# Patient Record
Sex: Female | Born: 1990 | Race: Black or African American | Hispanic: No | Marital: Married | Smoking: Never smoker
Health system: Southern US, Community
[De-identification: ages and names within clinical notes are randomized; demographics above are authoritative.]

## PROBLEM LIST (undated history)

## (undated) DIAGNOSIS — N912 Amenorrhea, unspecified: Secondary | ICD-10-CM

## (undated) HISTORY — DX: Amenorrhea, unspecified: N91.2

---
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
## 2015-08-09 ENCOUNTER — Encounter: Payer: Self-pay | Admitting: Obstetrics and Gynecology

## 2015-08-15 ENCOUNTER — Ambulatory Visit (INDEPENDENT_AMBULATORY_CARE_PROVIDER_SITE_OTHER): Payer: BLUE CROSS/BLUE SHIELD | Admitting: Obstetrics and Gynecology

## 2015-08-15 ENCOUNTER — Encounter: Payer: Self-pay | Admitting: Obstetrics and Gynecology

## 2015-08-15 VITALS — BP 118/78 | HR 84 | Resp 14 | Ht 66.0 in | Wt 144.0 lb

## 2015-08-15 DIAGNOSIS — N915 Oligomenorrhea, unspecified: Secondary | ICD-10-CM | POA: Diagnosis not present

## 2015-08-15 DIAGNOSIS — B373 Candidiasis of vulva and vagina: Secondary | ICD-10-CM | POA: Diagnosis not present

## 2015-08-15 DIAGNOSIS — N898 Other specified noninflammatory disorders of vagina: Secondary | ICD-10-CM | POA: Diagnosis not present

## 2015-08-15 DIAGNOSIS — Z0000 Encounter for general adult medical examination without abnormal findings: Secondary | ICD-10-CM

## 2015-08-15 DIAGNOSIS — Z124 Encounter for screening for malignant neoplasm of cervix: Secondary | ICD-10-CM | POA: Diagnosis not present

## 2015-08-15 DIAGNOSIS — Z113 Encounter for screening for infections with a predominantly sexual mode of transmission: Secondary | ICD-10-CM | POA: Diagnosis not present

## 2015-08-15 DIAGNOSIS — Z01419 Encounter for gynecological examination (general) (routine) without abnormal findings: Secondary | ICD-10-CM

## 2015-08-15 DIAGNOSIS — B3731 Acute candidiasis of vulva and vagina: Secondary | ICD-10-CM

## 2015-08-15 LAB — HDL CHOLESTEROL: HDL: 67 mg/dL (ref >=46)

## 2015-08-15 LAB — TSH: TSH: 1.385 u[IU]/mL (ref 0.350–4.500)

## 2015-08-15 LAB — HEMOGLOBIN, FINGERSTICK: HEMOGLOBIN, FINGERSTICK: 13.6 g/dL (ref 12.0–16.0)

## 2015-08-15 LAB — CHOLESTEROL, TOTAL: Cholesterol: 153 mg/dL (ref 125–200)

## 2015-08-15 LAB — POCT URINE PREGNANCY: Preg Test, Ur: NEGATIVE

## 2015-08-15 MED ORDER — FLUCONAZOLE 150 MG PO TABS: ORAL_TABLET | ORAL | Status: DC

## 2015-08-15 MED ORDER — LEVONORGEST-ETH ESTRAD 91-DAY 0.1-0.02 & 0.01 MG PO TABS: 1.0000 | ORAL_TABLET | Freq: Every day | ORAL | Status: DC

## 2015-08-15 NOTE — Progress Notes (Signed)
Patient ID: Lori Castillo, female   DOB: [birth year], 24 y.o.   MRN: [id number] 24 y.o. G0P0000 SingleAfrican AmericanF here for annual exam.  The patient reports menses q  2-3 months, typically bleeds x 4 days. Can saturate a super tampon in 4 hours, no BTB. Currently using condoms and w/d for contraception. No thyroid c/o, no galactorrhea, no hirsutism.  Period Duration (Days): 4 days  Period Pattern: (!) Irregular Menstrual Flow: Moderate Menstrual Control: Maxi pad, Tampon Dysmenorrhea: None On questioning she c/o a one week h/o severe vulvar pruritus, slight odor and a slight increase in white d/c.    Patient's last menstrual period was 06/11/2015.          Sexually active: Yes.    The current method of family planning is condoms sometimes.    Exercising: No.  The patient does not participate in regular exercise at present. Smoker:  no  Health Maintenance: Pap:  2015 normal per patient History of abnormal Pap:  no TDaP:  04/2015  Gardasil: N/A   reports that she has never smoked. She has never used smokeless tobacco. She reports that she drinks about 0.6 oz of alcohol per week. 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   Past Medical History  Diagnosis Date  . Amenorrhea     Past Surgical History  Procedure Laterality Date  . Nasal sinus surgery  2006    No current outpatient prescriptions on file.   No current facility-administered medications for this visit.    Family History  Problem Relation Age of Onset  . Diabetes Maternal Aunt   . Hypertension Paternal Aunt   . Diabetes Paternal Aunt   . Hypertension Paternal Uncle   . Diabetes Paternal Uncle   . Hypertension Paternal Grandmother   . Diabetes Paternal Grandmother   . Hypertension Paternal Grandfather   . Diabetes Paternal Grandfather     Review of Systems  Constitutional: Negative.   HENT: Negative.   Eyes: Negative.   Respiratory: Negative.    Cardiovascular: Negative.   Gastrointestinal: Negative.   Endocrine: Negative.   Genitourinary: Positive for menstrual problem.  Musculoskeletal: Negative.   Skin: Negative.   Allergic/Immunologic: Negative.   Neurological: Negative.   Psychiatric/Behavioral: Negative.     Exam:   BP 118/78 mmHg  Pulse 84  Resp 14  Ht  (1.676 m)  Wt 144 lb (65.318 kg)  BMI 23.25 kg/m2  LMP 06/11/2015  Weight change: @ Height:   Height:  (167.6 cm)  Ht Readings from Last 3 Encounters:  08/15/15  (1.676 m)    General appearance: alert, cooperative and appears stated age Head: Normocephalic, without obvious abnormality, atraumatic Neck: no adenopathy, supple, symmetrical, trachea midline and thyroid normal to inspection and palpation Lungs: clear to auscultation bilaterally Breasts: normal appearance, no masses or tenderness Heart: regular rate and rhythm Abdomen: soft, non-tender; bowel sounds normal; no masses,  no organomegaly Extremities: extremities normal, atraumatic, no cyanosis or edema Skin: Skin color, texture, turgor normal. No rashes or lesions Lymph nodes: Cervical, supraclavicular, and axillary nodes normal. No abnormal inguinal nodes palpated Neurologic: Grossly normal   Pelvic: External genitalia:  no lesions, slight erythema              Urethra:  normal appearing urethra with no masses, tenderness or lesions              Bartholins and Skenes: normal  Vagina: erythematous with a thick, clumpy, white vaginal d/c.               Cervix: no lesions               Bimanual Exam:  Uterus:  normal size, contour, position, consistency, mobility, non-tender              Adnexa: no mass, fullness, tenderness               Rectovaginal: Confirms               Anus:  normal sphincter tone, no lesions  Chaperone was present for exam.  A:  Well Woman with normal exam  Oligomenorrhea  Yeast vaginitis, Possible BV on wet prep as  well  Contraception  Screening for STD  P:   UPT negative  TSH, prolactin  Provera 5 mg x 5 days, she will call if no bleeding on OCP's  Will start Loseasonique on the first day of her pill, risks reviewed, no contraindication  Condoms encouraged  F/U for a pill check in 3 months  STD testing  Wet prep probe  Cholesterol/HDL

## 2015-08-15 NOTE — Patient Instructions (Addendum)
EXERCISE AND DIET:  We recommended that you start or continue a regular exercise program for good health. Regular exercise means any activity that makes your heart beat faster and makes you sweat.  We recommend exercising at least 30 minutes per day at least 3 days a week, preferably 4 or 5.  We also recommend a diet low in fat and sugar.  Inactivity, poor dietary choices and obesity can cause diabetes, heart attack, stroke, and kidney damage, among others.    ALCOHOL AND SMOKING:  Women should limit their alcohol intake to no more than 7 drinks/beers/glasses of wine (combined, not each!) per week. Moderation of alcohol intake to this level decreases your risk of breast cancer and liver damage. And of course, no recreational drugs are part of a healthy lifestyle.  And absolutely no smoking or even second hand smoke. Most people know smoking can cause heart and lung diseases, but did you know it also contributes to weakening of your bones? Aging of your skin?  Yellowing of your teeth and nails?  CALCIUM AND VITAMIN D:  Adequate intake of calcium and Vitamin D are recommended.  The recommendations for exact amounts of these supplements seem to change often, but generally speaking 600 mg of calcium (either carbonate or citrate) and 800 units of Vitamin D per day seems prudent. Certain women may benefit from higher intake of Vitamin D.  If you are among these women, your doctor will have told you during your visit.    PAP SMEARS:  Pap smears, to check for cervical cancer or precancers,  have traditionally been done yearly, although recent scientific advances have shown that most women can have pap smears less often.  However, every woman still should have a physical exam from her gynecologist every year. It will include a breast check, inspection of the vulva and vagina to check for abnormal growths or skin changes, a visual exam of the cervix, and then an exam to evaluate the size and shape of the uterus and  ovaries.  And after 22 years of age, a rectal exam is indicated to check for rectal cancers. We will also provide age appropriate advice regarding health maintenance, like when you should have certain vaccines, screening for sexually transmitted diseases, bone density testing, colonoscopy, mammograms, etc.   MAMMOGRAMS:  All women over 40 years old should have a yearly mammogram. Many facilities now offer a "3D" mammogram, which may cost around $50 extra out of pocket. If possible,  we recommend you accept the option to have the 3D mammogram performed.  It both reduces the number of women who will be called back for extra views which then turn out to be normal, and it is better than the routine mammogram at detecting truly abnormal areas.    COLONOSCOPY:  Colonoscopy to screen for colon cancer is recommended for all women at age 50.  We know, you hate the idea of the prep.  We agree, BUT, having colon cancer and not knowing it is worse!!  Colon cancer so often starts as a polyp that can be seen and removed at colonscopy, which can quite literally save your life!  And if your first colonoscopy is normal and you have no family history of colon cancer, most women don't have to have it again for 10 years.  Once every ten years, you can do something that may end up saving your life, right?  We will be happy to help you get it scheduled when you are ready.    Be sure to check your insurance coverage so you understand how much it will cost.  It may be covered as a preventative service at no cost, but you should check your particular policy.     Oral Contraception Information Oral contraceptive pills (OCPs) are medicines taken to prevent pregnancy. OCPs work by preventing the ovaries from releasing eggs. The hormones in OCPs also cause the cervical mucus to thicken, preventing the sperm from entering the uterus. The hormones also cause the uterine lining to become thin, not allowing a fertilized egg to attach to the  inside of the uterus. OCPs are highly effective when taken exactly as prescribed. However, OCPs do not prevent sexually transmitted diseases (STDs). Safe sex practices, such as using condoms along with the pill, can help prevent STDs.  Before taking the pill, you may have a physical exam and Pap test. Your health care provider may order blood tests. The health care provider will make sure you are a good candidate for oral contraception. Discuss with your health care provider the possible side effects of the OCP you may be prescribed. When starting an OCP, it can take 2 to 3 months for the body to adjust to the changes in hormone levels in your body.  TYPES OF ORAL CONTRACEPTION  The combination pill--This pill contains estrogen and progestin (synthetic progesterone) hormones. The combination pill comes in 21-day, 28-day, or 91-day packs. Some types of combination pills are meant to be taken continuously (365-day pills). With 21-day packs, you do not take pills for 7 days after the last pill. With 28-day packs, the pill is taken every day. The last 7 pills are without hormones. Certain types of pills have more than 21 hormone-containing pills. With 91-day packs, the first 84 pills contain both hormones, and the last 7 pills contain no hormones or contain estrogen only.  The minipill--This pill contains the progesterone hormone only. The pill is taken every day continuously. It is very important to take the pill at the same time each day. The minipill comes in packs of 28 pills. All 28 pills contain the hormone.  ADVANTAGES OF ORAL CONTRACEPTIVE PILLS  Decreases premenstrual symptoms.   Treats menstrual period cramps.   Regulates the menstrual cycle.   Decreases a heavy menstrual flow.   May treatacne, depending on the type of pill.   Treats abnormal uterine bleeding.   Treats polycystic ovarian syndrome.   Treats endometriosis.   Can be used as emergency contraception.  THINGS  THAT CAN MAKE ORAL CONTRACEPTIVE PILLS LESS EFFECTIVE OCPs can be less effective if:   You forget to take the pill at the same time every day.   You have a stomach or intestinal disease that lessens the absorption of the pill.   You take OCPs with other medicines that make OCPs less effective, such as antibiotics, certain HIV medicines, and some seizure medicines.   You take expired OCPs.   You forget to restart the pill on day 7, when using the packs of 21 pills.  RISKS ASSOCIATED WITH ORAL CONTRACEPTIVE PILLS  Oral contraceptive pills can sometimes cause side effects, such as:  Headache.  Nausea.  Breast tenderness.  Irregular bleeding or spotting. Combination pills are also associated with a small increased risk of:  Blood clots.  Heart attack.  Stroke.   This information is not intended to replace advice given to you by your health care provider. Make sure you discuss any questions you have with your health care provider.     Document Released: 01/03/2003 Document Revised: 08/03/2013 Document Reviewed: 04/03/2013 Elsevier Interactive Patient Education 2016 Elsevier Inc.  

## 2015-08-16 ENCOUNTER — Telehealth: Payer: Self-pay | Admitting: Emergency Medicine

## 2015-08-16 ENCOUNTER — Telehealth: Payer: Self-pay | Admitting: Obstetrics and Gynecology

## 2015-08-16 LAB — STD PANEL
HIV 1&2 Ab, 4th Generation: NONREACTIVE
Hepatitis B Surface Ag: NEGATIVE

## 2015-08-16 LAB — PROLACTIN: PROLACTIN: 7.9 ng/mL

## 2015-08-16 LAB — WET PREP BY MOLECULAR PROBE
Candida species: POSITIVE — AB
Gardnerella vaginalis: NEGATIVE
Trichomonas vaginosis: NEGATIVE

## 2015-08-16 LAB — HEPATITIS C ANTIBODY: HCV Ab: NEGATIVE

## 2015-08-16 LAB — IPS PAP SMEAR ONLY

## 2015-08-16 MED ORDER — MEDROXYPROGESTERONE ACETATE 5 MG PO TABS: 5.0000 mg | ORAL_TABLET | Freq: Every day | ORAL | Status: DC

## 2015-08-16 NOTE — Telephone Encounter (Signed)
Spoke with patient. Patient states that when she went to the pharmacy to pick up her prescriptions they only had her OCP and Diflucan. Per review of office visit note patient was to also have Provera 5 mg for 5 days to start cycle before starting Loseasonique. Advised rx for Provera 5 mg #5 0RF has been sent to pharmacy on file. Patient is agreeable.  A: Well Woman with normal exam Oligomenorrhea Yeast vaginitis, Possible BV on wet prep as well Contraception Screening for STD  P: UPT negative TSH, prolactin Provera 5 mg x 5 days, she will call if no bleeding on OCP's Will start Loseasonique on the first day of her pill, risks reviewed, no contraindication Condoms encouraged F/U for a pill check in 3 months STD testing Wet prep probe Cholesterol/HDL  Routing to provider for final review. Patient agreeable to disposition. Will close encounter.

## 2015-08-16 NOTE — Telephone Encounter (Signed)
Patient was told that she was getting 3 prescriptions on yesterday but only 2 was at the pharmacy.

## 2015-08-16 NOTE — Telephone Encounter (Signed)
Spoke with patient. She is given message from Dr. Oscar LaJertson. She will call back if she does not have any bleeding and verbalized understanding of how to start OCP. Results discussed. Will call back with GC and chlamydia results.  Routing to provider for final review. Patient agreeable to disposition. Will close encounter.

## 2015-08-16 NOTE — Telephone Encounter (Signed)
-----   Message from Romualdo BolkJill Evelyn Jertson, MD sent at 08/16/2015  4:13 PM EDT ----- I forgot to send a script for provera, 5 mg x 5 days. She should start this now, call with or without bleeding (should bleed within a week of ending it). She shouldn't start the birth control until the first day of her bleeding. Please let her know that her vaginitis probe returned with yeast, but no BV. She was given a script for diflucan.  Her GC/chlamydia test is pending, everything else was normal.

## 2015-08-17 LAB — IPS N GONORRHOEA AND CHLAMYDIA BY PCR

## 2015-09-10 ENCOUNTER — Telehealth: Payer: Self-pay | Admitting: Obstetrics and Gynecology

## 2015-09-10 NOTE — Telephone Encounter (Signed)
She should continue taking 1 pill a day as close to the same time as possible. She may have funny bleeding the first 3 months.  Confirm that she had bleeding after taking the provera and prior to starting OCP's. Thanks!

## 2015-09-10 NOTE — Telephone Encounter (Signed)
Patient has been taking her birth control for about a 1.5 weeks and missed one dose. Patient is experiencing heavy bleeding and wondering if she should be alarmed?.Marland Kitchen

## 2015-09-10 NOTE — Telephone Encounter (Signed)
Spoke with patient. Patient states that she started taking Loseaonique 2 weeks ago. Last Thursday 11/10 she missed a pill. Started bleeding on Friday 11/11. States she is bleeding like a "normal" cycle. Is saturating a tampon every 4 hours. Denies missing any more pills or taking pills late. Advised I will speak with Dr.Jertson regarding her bleeding with OCP and return call with further recommendations. Patient is agreeable.

## 2015-09-10 NOTE — Telephone Encounter (Signed)
Left detailed message at number provided [phone], okay per ROI. Advised of message from Dr.Jertson as seen below. Requested return call to discuss Provera with patient.

## 2015-09-13 NOTE — Telephone Encounter (Signed)
Spoke with patient. Verified that the patient received my voicemail with message from Dr.Jertson. Patient is agreeable and has been taking her pills at the same time daily. Patient did have bleeding after taking Provera and started OCP on first day of bleeding. Will continue to take one pill daily at the same time. Will monitor current symptoms and return call with worsening symptoms or any concerns.  Routing to provider for final review. Patient agreeable to disposition. Will close encounter.

## 2015-09-13 NOTE — Telephone Encounter (Signed)
Returned call

## 2015-09-13 NOTE — Telephone Encounter (Signed)
Left message to call Lynette Noah at [phone]. 

## 2015-11-19 ENCOUNTER — Telehealth: Payer: Self-pay | Admitting: Obstetrics and Gynecology

## 2015-11-19 MED ORDER — LEVONORGEST-ETH ESTRAD 91-DAY 0.1-0.02 & 0.01 MG PO TABS: 1.0000 | ORAL_TABLET | Freq: Every day | ORAL | Status: DC

## 2015-11-19 NOTE — Telephone Encounter (Signed)
Spoke with patient. Patient states that she needs a refill on her birth control. Is currently taking LoSeasonique. Was last seen on 11/14/2014. Patient is currently on the placebo week of her pill pack. Advised she will need to be seen for a follow up appointment in the office for refills of OCP. States she is doing well on LoSeasonique at this time. LoSeasonique #1 0RF sent to patient pharmacy on file to get her to her appointment on 11/22/2015 at 10:30 am with Dr.Jertson. Patient is agreeable to date and time.  Routing to provider for final review. Patient agreeable to disposition. Will close encounter.

## 2015-11-19 NOTE — Telephone Encounter (Signed)
1. Patient has some questions about her birth control but also needs refills.  2. Patient calling requesting a refill on her birth control. Pharmacy on file is correct.

## 2015-11-22 ENCOUNTER — Ambulatory Visit (INDEPENDENT_AMBULATORY_CARE_PROVIDER_SITE_OTHER): Payer: 59 | Admitting: Obstetrics and Gynecology

## 2015-11-22 ENCOUNTER — Encounter: Payer: Self-pay | Admitting: Obstetrics and Gynecology

## 2015-11-22 VITALS — BP 102/60 | HR 80 | Resp 13 | Wt 155.0 lb

## 2015-11-22 DIAGNOSIS — Z3041 Encounter for surveillance of contraceptive pills: Secondary | ICD-10-CM | POA: Diagnosis not present

## 2015-11-22 MED ORDER — LEVONORGEST-ETH ESTRAD 91-DAY 0.1-0.02 & 0.01 MG PO TABS: 1.0000 | ORAL_TABLET | Freq: Every day | ORAL | Status: DC

## 2015-11-22 MED FILL — CAMRESE LO TABLET: 0.1-0.02 & | 90 days supply | Qty: 91 | Fill #0

## 2015-11-22 NOTE — Progress Notes (Signed)
Patient ID: Lori Castillo, female   DOB: [birth year], 25 y.o.   MRN: [id number] GYNECOLOGY  VISIT   HPI: 25 y.o.   Single  African American  female   G0P0000 with Patient's last menstrual period was 11/18/2015.   here to follow up on her OCP. She has been having bleeding since starting. She is on loseasonique, due to start a new pack tomorrow. Spotting the whole time, she just had a cycle. Bleed x 2-3 days, saturating a a tampon in up to 4 hours. Slight, tolerable cramping.   GYNECOLOGIC HISTORY: Patient's last menstrual period was 11/18/2015. Contraception:OCP Menopausal hormone therapy: N/A        OB History    Gravida Para Term Preterm AB TAB SAB Ectopic Multiple Living           There are no active problems to display for this patient.   Past Medical History  Diagnosis Date  . Amenorrhea     Past Surgical History  Procedure Laterality Date  . Nasal sinus surgery  2006    Current Outpatient Prescriptions  Medication Sig Dispense Refill  . Levonorgestrel-Ethinyl Estradiol (LOSEASONIQUE) 0.1-0.02 & 0.01 MG tablet Take 1 tablet by mouth daily. 1 Package 0   No current facility-administered medications for this visit.     ALLERGIES: Bactrim  Family History  Problem Relation Age of Onset  . Diabetes Maternal Aunt   . Hypertension Paternal Aunt   . Diabetes Paternal Aunt   . Hypertension Paternal Uncle   . Diabetes Paternal Uncle   . Hypertension Paternal Grandmother   . Diabetes Paternal Grandmother   . Hypertension Paternal Grandfather   . Diabetes Paternal Grandfather     Social History   Social History  . Marital Status: Single    Spouse Name: N/A  . Number of Children: N/A  . Years of Education: N/A   Occupational History  . Not on file.   Social History Main Topics  . Smoking status: Never Smoker   . Smokeless tobacco: Never Used  . Alcohol Use: 0.6 oz/week    1 Standard drinks or equivalent per week  . Drug Use: No  . Sexual  Activity:    Partners: Male   Other Topics Concern  . Not on file   Social History Narrative    Review of Systems  Constitutional: Negative.   HENT: Negative.   Eyes: Negative.   Respiratory: Negative.   Cardiovascular: Negative.   Gastrointestinal: Negative.   Genitourinary:       Irregular bleeding  Musculoskeletal: Negative.   Skin: Negative.   Neurological: Negative.   Endo/Heme/Allergies: Negative.   Psychiatric/Behavioral: Negative.     PHYSICAL EXAMINATION:    BP 102/60 mmHg  Pulse 80  Resp 13  Wt 155 lb (70.308 kg)  LMP 11/18/2015    General appearance: alert, cooperative and appears stated age  ASSESSMENT Pill check, she is just finishing her first pack of loseasonique, spotted throughout. No other c/o H/O oligomenorrhea with normal labs prior to starting OCP's    PLAN Continue OCP's, if her spotting doesn't improve she will call   An After Visit Summary was printed and given to the patient.

## 2015-12-18 ENCOUNTER — Telehealth: Payer: Self-pay | Admitting: Obstetrics and Gynecology

## 2015-12-18 NOTE — Telephone Encounter (Signed)
Attempted to reach patient at number provided [phone]. There was no answer and recording states that the voicemail box is currently full and is not accepting new messages at this time. 

## 2015-12-18 NOTE — Telephone Encounter (Signed)
Patient left message on our voicemail returning your call. [phone]

## 2015-12-18 NOTE — Telephone Encounter (Signed)
Patient wants to speak with the nurse concerning her birth control.

## 2015-12-18 NOTE — Telephone Encounter (Addendum)
Attempted to reach patient at number provided [phone]. There was no answer and recording states that the voicemail box is currently full and is not accepting new messages at this time.

## 2015-12-18 NOTE — Telephone Encounter (Signed)
Left message to call Kaitlyn at [phone]. 

## 2015-12-19 NOTE — Telephone Encounter (Signed)
Spoke with patient. Advised of message as seen below from Dr.Jertson. She states she would like to continue taking LoSeasonique at this time and see how she does. "I am worried about making a change when my body may just need time to adjust." Reports she would like to take the [facility] for 1 more month to see how she does. If irregular bleeding continues and she would like to switch OCP to Loestrin 1/20, 28 she will return call to the office. Aware if her bleeding is heavy or she begins to feel fatigued, dizzy, or light headed with bleeding she will need to be seen in the office for evaluation.  Routing to provider for final review. Patient agreeable to disposition. Will close encounter.

## 2015-12-19 NOTE — Telephone Encounter (Signed)
Please change her to loestrin 1/20, 28 day. Give her enough to get to her annual. If she is still having issues she should call. She can stop the loseasonique after finishing a full 3 weeks. Take 4 days off and then restart the new pill. Use condoms if sexually active.

## 2015-12-19 NOTE — Telephone Encounter (Signed)
Spoke with patient. Patient states that she is in her 4th month of taking LoSeasonique and she is experiencing continued irregular bleeding. She was seen in the office on 11/22/2015 with Dr.Jertson to discuss irregular bleeding and was advised to call if this persisted. States she is in the 3rd week of her current pack and is having bleeding like her normal cycle. "I am cramping. I have headaches and I just feel blah." Reports she is taking her pills at the same time daily and has not missed any pills. Advised patient it is not uncommon to have irregular bleeding with LoSeasonique as you are taking pills for three months and then cycling. When you body is used to having a cycle once per month. Patient wants to remain on oral OCP. Advised I will speak with Dr.Jertson regarding symptoms and possible alternatives and return call with further recommendations. She is agreeable.

## 2016-02-11 ENCOUNTER — Telehealth: Payer: Self-pay | Admitting: Obstetrics and Gynecology

## 2016-02-11 NOTE — Telephone Encounter (Signed)
Call to patient. She states she has been having intermittent spotting x 2 weeks. [facility] visit with Dr. Oscar LaJertson.  Patient has called to office multiple times with irregular bleeding and options have been discussed with her. At this time, patient states she would like to stop birth control completely and wants to know Dr. Salli QuarryJertson's opinion. Patient is getting married in July and states she is tired of bleeding more than she is not bleeding. Denies heavy bleeding, having spotting only. Advised patient that she can certainly stop her birth control at any time, but advised that there are many options for birth control and offered consult with Dr. Oscar LaJertson for patient to discuss her concerns and discuss options for her so she can have reliable birth control if she desires. Patient planning on using condoms and withdrawal.  Patient accepts consult appointment with Dr. Oscar LaJertson and is scheduled at her convenience for 02/14/16.  She is advised to call back with any concerns prior to appointment.  Routing to provider for final review. Patient agreeable to disposition. Will close encounter.

## 2016-02-11 NOTE — Telephone Encounter (Signed)
Patient has a question about her birth control. °

## 2016-02-14 ENCOUNTER — Ambulatory Visit: Payer: 59 | Admitting: Obstetrics and Gynecology

## 2016-02-18 ENCOUNTER — Ambulatory Visit (INDEPENDENT_AMBULATORY_CARE_PROVIDER_SITE_OTHER): Payer: 59 | Admitting: Obstetrics and Gynecology

## 2016-02-18 ENCOUNTER — Encounter: Payer: Self-pay | Admitting: Obstetrics and Gynecology

## 2016-02-18 VITALS — BP 110/70 | HR 84 | Resp 16 | Wt 157.0 lb

## 2016-02-18 DIAGNOSIS — N926 Irregular menstruation, unspecified: Secondary | ICD-10-CM

## 2016-02-18 LAB — POCT URINE PREGNANCY: PREG TEST UR: NEGATIVE

## 2016-02-18 MED ORDER — NORETHIN ACE-ETH ESTRAD-FE 1-20 MG-MCG PO TABS: 1.0000 | ORAL_TABLET | Freq: Every day | ORAL | Status: DC

## 2016-02-18 NOTE — Progress Notes (Signed)
Patient ID: Lori Castillo, female   DOB: 10/20/1991, 25 y.o.   MRN: [id number] GYNECOLOGY  VISIT   HPI: 25 y.o.   Single  African American  female   G0P0000 with Patient's last menstrual period was 11/20/2015 (approximate).   here to discuss birth control options. She continues to have irregular spotting on the LoSeasonique. She can have moderate bleeding for up to 10 days, then no bleeding, then spotting. She is otherwise tolerating the pill. She is getting married on July 1st. Doesn't want to get pregnant for years.   GYNECOLOGIC HISTORY: Patient's last menstrual period was 11/20/2015 (approximate). Contraception:OCP Menopausal hormone therapy: None        OB History    Gravida Para Term Preterm AB TAB SAB Ectopic Multiple Living   0 0 0 0 0 0 0 0 0 0          There are no active problems to display for this patient.   Past Medical History  Diagnosis Date  . Amenorrhea     Past Surgical History  Procedure Laterality Date  . Nasal sinus surgery  2006    Current Outpatient Prescriptions  Medication Sig Dispense Refill  . Levonorgestrel-Ethinyl Estradiol (LOSEASONIQUE) 0.1-0.02 & 0.01 MG tablet Take 1 tablet by mouth daily. 1 Package 2   No current facility-administered medications for this visit.     ALLERGIES: Bactrim  Family History  Problem Relation Age of Onset  . Diabetes Maternal Aunt   . Hypertension Paternal Aunt   . Diabetes Paternal Aunt   . Hypertension Paternal Uncle   . Diabetes Paternal Uncle   . Hypertension Paternal Grandmother   . Diabetes Paternal Grandmother   . Hypertension Paternal Grandfather   . Diabetes Paternal Grandfather     Social History   Social History  . Marital Status: Single    Spouse Name: N/A  . Number of Children: N/A  . Years of Education: N/A   Occupational History  . Not on file.   Social History Main Topics  . Smoking status: Never Smoker   . Smokeless tobacco: Never Used  . Alcohol Use: 0.6 oz/week    1  Standard drinks or equivalent per week  . Drug Use: No  . Sexual Activity:    Partners: Male   Other Topics Concern  . Not on file   Social History Narrative    Review of Systems  Constitutional: Negative.   Eyes: Negative.   Respiratory: Negative.   Cardiovascular: Negative.   Gastrointestinal: Negative.   Genitourinary:       Unscheduled bleeding   Musculoskeletal: Negative.   Skin: Negative.   Neurological: Negative.   Endo/Heme/Allergies: Negative.   Psychiatric/Behavioral: Negative.     PHYSICAL EXAMINATION:    BP 110/70 mmHg  Pulse 84  Resp 16  Wt 157 lb (71.215 kg)  LMP 11/20/2015 (Approximate)    General appearance: alert, cooperative and appears stated age  ASSESSMENT Continued BTB on LoSeasonique Discussed all of her options for contraception She has a h/o oligomenorrhea    PLAN Change to a one month pill She will call if she continues to spot.   An After Visit Summary was printed and given to the patient.  15 minutes face to face time of which over 50% was spent in counseling.

## 2016-02-25 ENCOUNTER — Telehealth: Payer: Self-pay | Admitting: Obstetrics and Gynecology

## 2016-02-25 NOTE — Telephone Encounter (Signed)
Spoke with patient. Patient states that she spoke with Dr.Jertson last week about switching her OCP for 1 month due to irregular spotting while on LoSeasonique. Reports she has decided she does not want to start a new birth control and risk weight gain and further irregular bleeding before her wedding. Patient has 3 days left in her Loseasonique OCP pack and would like to stop taking OCP after completing the next three days. Started her menses on 02/22/2016. Reports she spoke with Dr.Jertson about taking Provera to help start her cycle so she would not be on her cycle the day of her wedding. Asking if she may take Provera and not start OCP. Advised I will speak with Dr.Jertson and return call with further recommendations. She is agreeable.

## 2016-02-25 NOTE — Telephone Encounter (Signed)
Patient called and said she'd like to speak with the nurse about her birth control and her upcoming wedding.

## 2016-02-26 NOTE — Telephone Encounter (Signed)
She can take provera 5 mg x 5 days 2 weeks prior to her wedding. This may or not help, it should empty out her uterus, but she could still potentially bleed the week of her wedding/honeymoon. It won't stop her from ovulating. She can wait and see when her cycle comes and look ahead to see when her next cycle would be due.  The smarter option would be to continue OCP's, the one month pack and plan it so her menses comes the week prior to her cycle and then not restart the pill. Condoms for contraception.

## 2016-02-27 MED ORDER — MEDROXYPROGESTERONE ACETATE 5 MG PO TABS
5.0000 mg | ORAL_TABLET | Freq: Every day | ORAL | Status: DC
Start: 2016-02-27 — End: 2016-05-12

## 2016-02-27 NOTE — Telephone Encounter (Signed)
Spoke with patient. Advised of message as seen below from Dr.Jertson. Patient thinks that she would like to take the Provera 5 mg x 5 days 2 weeks prior to her cycle. Is worried about starting a new birth control right before her wedding. She would like rx for Provera sent to her pharmacy. Requesting rx for Junel Fe be left at the pharmacy as well in case she changes her mind. She will return call if she has any further questions or concerns.  Routing to provider for final review. Patient agreeable to disposition. Will close encounter.

## 2016-02-27 NOTE — Telephone Encounter (Signed)
Attempted to reach patient at number provided [phone]. There was no answer and recording states that the voicemail box is currently full and is not accepting new messages at this time.

## 2016-05-12 ENCOUNTER — Ambulatory Visit (INDEPENDENT_AMBULATORY_CARE_PROVIDER_SITE_OTHER): Payer: 59 | Admitting: Obstetrics and Gynecology

## 2016-05-12 ENCOUNTER — Encounter: Payer: Self-pay | Admitting: Obstetrics and Gynecology

## 2016-05-12 VITALS — BP 110/70 | HR 68 | Resp 12 | Ht 66.0 in | Wt 153.0 lb

## 2016-05-12 DIAGNOSIS — Z3009 Encounter for other general counseling and advice on contraception: Secondary | ICD-10-CM | POA: Diagnosis not present

## 2016-05-12 DIAGNOSIS — N915 Oligomenorrhea, unspecified: Secondary | ICD-10-CM | POA: Diagnosis not present

## 2016-05-12 MED ORDER — MISOPROSTOL 200 MCG PO TABS: ORAL_TABLET | ORAL | Status: DC

## 2016-05-12 MED FILL — miSOPROStol 200 MCG TABS: 200 | 1 days supply | Qty: 2 | Fill #0

## 2016-05-12 NOTE — Progress Notes (Signed)
GYNECOLOGY  VISIT   HPI: 25 y.o.   Single  African American  female   G0P0000 with Patient's last menstrual period was 05/08/2016.   here to discuss other BC options. Patient is thinking about the Nexplanon & IUD. She doesn't want anything to interfere with her working out.   She has a h/o oligomenorrhea, in the fall she had a normal TSH and prolactin, +W/D to provera and was started on loseasonique. She was on the loseasonique for about 6 months. Continued to have abnormal bleeding. She stopped the pill in 5/17. She got married a few weeks ago. She had unprotected sex last week. She has had 2 cycles off of OCP's, LMP 05/08/16, PMP 03/30/16. Prior to starting OCP's her cycles were every 2-3 months.  OCP's made her miserable, feels so good off. Doesn't want to get pregnant for the next couple of years.   GYNECOLOGIC HISTORY: Patient's last menstrual period was 05/08/2016. Contraception:Condoms sometimes Menopausal hormone therapy: no         OB History    Gravida Para Term Preterm AB TAB SAB Ectopic Multiple Living   0 0 0 0 0 0 0 0 0 0          There are no active problems to display for this patient.   Past Medical History  Diagnosis Date  . Amenorrhea     Past Surgical History  Procedure Laterality Date  . Nasal sinus surgery  2006    Current Outpatient Prescriptions  Medication Sig Dispense Refill  . misoprostol (CYTOTEC) 200 MCG tablet Place 2 tablets intravaginally 6-12 hours prior to IUD insertion 2 tablet 0   No current facility-administered medications for this visit.     ALLERGIES: Bactrim  Family History  Problem Relation Age of Onset  . Diabetes Maternal Aunt   . Hypertension Paternal Aunt   . Diabetes Paternal Aunt   . Hypertension Paternal Uncle   . Diabetes Paternal Uncle   . Hypertension Paternal Grandmother   . Diabetes Paternal Grandmother   . Hypertension Paternal Grandfather   . Diabetes Paternal Grandfather     Social History   Social History   . Marital Status: Single    Spouse Name: N/A  . Number of Children: N/A  . Years of Education: N/A   Occupational History  . Not on file.   Social History Main Topics  . Smoking status: Never Smoker   . Smokeless tobacco: Never Used  . Alcohol Use: 0.6 oz/week    1 Standard drinks or equivalent per week  . Drug Use: No  . Sexual Activity:    Partners: Male   Other Topics Concern  . Not on file   Social History Narrative    ROS  PHYSICAL EXAMINATION:    BP 110/70 mmHg  Pulse 68  Resp 12  Ht 5\' 6"  (1.676 m)  Wt 153 lb (69.4 kg)  BMI 24.71 kg/m2  LMP 05/08/2016    General appearance: alert, cooperative and appears stated age  ASSESSMENT H/O oligomenorrhea, she had 2 cycles since stopping OCP's, about 5 weeks apart Contraception, doesn't want to try another OCP, worried about side effects of contraception.     PLAN Reviewed options of the nexplanon and IUD's (hormonal and non hormonal). Given her h/o oligomenorrhea endometrial protection would be benificial Reviewed risks of IUD insertion She will return for a kyleena IUD Last sexually active 5 days ago, she will abstain, return for a BhcG next week. If negative she will return for  the [employer], cytotec script written (she knows not to take until negative BhcG)   An After Visit Summary was printed and given to the patient.  15 minutes face to face time of which over 50% was spent in counseling.

## 2016-05-15 ENCOUNTER — Emergency Department
Admission: EM | Admit: 2016-05-15 | Discharge: 2016-05-15 | Disposition: A | Discharge status: Home / Self Care | Payer: 59 | Attending: Emergency Medicine | Admitting: Emergency Medicine

## 2016-05-15 ENCOUNTER — Encounter: Payer: Self-pay | Admitting: Physician Assistant

## 2016-05-15 ENCOUNTER — Ambulatory Visit: Payer: Self-pay | Admitting: Physician Assistant

## 2016-05-15 VITALS — BP 130/70 | HR 76 | Temp 97.9°F

## 2016-05-15 DIAGNOSIS — R42 Dizziness and giddiness: Secondary | ICD-10-CM

## 2016-05-15 DIAGNOSIS — G441 Vascular headache, not elsewhere classified: Secondary | ICD-10-CM

## 2016-05-15 DIAGNOSIS — R51 Headache: Secondary | ICD-10-CM | POA: Insufficient documentation

## 2016-05-15 DIAGNOSIS — R270 Ataxia, unspecified: Secondary | ICD-10-CM | POA: Diagnosis not present

## 2016-05-15 DIAGNOSIS — H8110 Benign paroxysmal vertigo, unspecified ear: Secondary | ICD-10-CM

## 2016-05-15 DIAGNOSIS — R519 Headache, unspecified: Secondary | ICD-10-CM

## 2016-05-15 LAB — BASIC METABOLIC PANEL
Anion gap: 7 (ref 5–15)
BUN: 11 mg/dL (ref 6–20)
CHLORIDE: 106 mmol/L (ref 101–111)
CO2: 26 mmol/L (ref 22–32)
CREATININE: 0.56 mg/dL (ref 0.44–1.00)
Calcium: 9.4 mg/dL (ref 8.9–10.3)
GFR calc Af Amer: >60 mL/min (ref >60)
GFR calc non Af Amer: >60 mL/min (ref >60)
Glucose, Bld: 78 mg/dL (ref 65–99)
POTASSIUM: 3.7 mmol/L (ref 3.5–5.1)
Sodium: 139 mmol/L (ref 135–145)

## 2016-05-15 LAB — CBC
HEMATOCRIT: 38.4 % (ref 35.0–47.0)
Hemoglobin: 12.9 g/dL (ref 12.0–16.0)
MCH: 27 pg (ref 26.0–34.0)
MCHC: 33.5 g/dL (ref 32.0–36.0)
MCV: 80.5 fL (ref 80.0–100.0)
PLATELETS: 324 10*3/uL (ref 150–440)
RBC: 4.77 MIL/uL (ref 3.80–5.20)
RDW: 17.6 % — AB (ref 11.5–14.5)
WBC: 6.4 10*3/uL (ref 3.6–11.0)

## 2016-05-15 LAB — POCT URINALYSIS DIPSTICK
BILIRUBIN UA: NEGATIVE
GLUCOSE UA: NEGATIVE
Ketones, UA: NEGATIVE
Leukocytes, UA: NEGATIVE
NITRITE UA: NEGATIVE
Protein, UA: NEGATIVE
Spec Grav, UA: 1.01
Urobilinogen, UA: 0.2
pH, UA: 6.5

## 2016-05-15 LAB — POCT URINE PREGNANCY: PREG TEST UR: NEGATIVE

## 2016-05-15 LAB — GLUCOSE, CAPILLARY: Glucose-Capillary: 64 mg/dL — ABNORMAL LOW (ref 65–99)

## 2016-05-15 MED ORDER — KETOROLAC TROMETHAMINE 30 MG/ML IJ SOLN
30.0000 mg | Freq: Once | INTRAMUSCULAR | Status: AC
  Administered 2016-05-15: 30 mg via INTRAVENOUS

## 2016-05-15 MED ORDER — KETOROLAC TROMETHAMINE 30 MG/ML IJ SOLN
INTRAMUSCULAR | Status: AC
  Administered 2016-05-15: 30 mg via INTRAVENOUS
  Filled 2016-05-15: qty 1

## 2016-05-15 MED ORDER — SODIUM CHLORIDE 0.9 % IV SOLN
Freq: Once | INTRAVENOUS | Status: AC
  Administered 2016-05-15: 14:00:00 via INTRAVENOUS

## 2016-05-15 MED ORDER — ONDANSETRON 4 MG PO TBDP
ORAL_TABLET | ORAL | Status: AC
  Filled 2016-05-15: qty 1

## 2016-05-15 MED ORDER — METOCLOPRAMIDE HCL 5 MG/ML IJ SOLN
INTRAMUSCULAR | Status: AC
  Administered 2016-05-15: 10 mg via INTRAVENOUS
  Filled 2016-05-15: qty 2

## 2016-05-15 MED ORDER — BUTALBITAL-APAP-CAFFEINE 50-325-40 MG PO TABS: 1.0000 | ORAL_TABLET | Freq: Four times a day (QID) | ORAL | Status: DC | PRN

## 2016-05-15 MED ORDER — METOCLOPRAMIDE HCL 5 MG/ML IJ SOLN
10.0000 mg | Freq: Once | INTRAMUSCULAR | Status: AC
  Administered 2016-05-15: 10 mg via INTRAVENOUS
  Filled 2016-05-15: qty 2

## 2016-05-15 MED ORDER — ONDANSETRON 4 MG PO TBDP
4.0000 mg | ORAL_TABLET | Freq: Once | ORAL | Status: AC | PRN
  Administered 2016-05-15: 4 mg via ORAL

## 2016-05-15 NOTE — ED Notes (Signed)
Pt reports having head pain that began while at work today. Pt reports she was walking around and became dizzy and nauseous. PT states the head pain worsens when she moves her head or tries to walk again but it decreases when she is able to lye still. Pt in NAD at this time.

## 2016-05-15 NOTE — ED Notes (Addendum)
Pt reports going to employee health today and having a urine preg test performed. This RN called employee health to request urine sample results. RN reports she would enter results.

## 2016-05-15 NOTE — ED Notes (Signed)
Urine sample and urine pregnancy test taken at [facility] where patient visited today.

## 2016-05-15 NOTE — Addendum Note (Signed)
Addended by: Yvonne KendallBROWN, Jeiden Daughtridge D on: 05/15/2016 02:33 PM   Modules accepted: Orders

## 2016-05-15 NOTE — ED Notes (Signed)
Pt to ER c/o dizziness, nausea and headache that began at 10:00AM. Pt was at work when occurred. Pt alert and oriented X4, active, cooperative, pt in NAD. RR even and unlabored, color WNL.  Pt states dizziness okay while sitting down.

## 2016-05-15 NOTE — ED Notes (Signed)
CBG 64

## 2016-05-15 NOTE — Discharge Instructions (Signed)
Dizziness °Dizziness is a common problem. It is a feeling of unsteadiness or light-headedness. You may feel like you are about to faint. Dizziness can lead to injury if you stumble or fall. Anyone can become dizzy, but dizziness is more common in older adults. This condition can be caused by a number of things, including medicines, dehydration, or illness. °HOME CARE INSTRUCTIONS °Taking these steps may help with your condition: °Eating and Drinking °· Drink enough fluid to keep your urine clear or pale yellow. This helps to keep you from becoming dehydrated. Try to drink more clear fluids, such as water. °· Do not drink alcohol. °· Limit your caffeine intake if directed by your health care provider. °· Limit your salt intake if directed by your health care provider. °Activity °· Avoid making quick movements. °¨ Rise slowly from chairs and steady yourself until you feel okay. °¨ In the morning, first sit up on the side of the bed. When you feel okay, stand slowly while you hold onto something until you know that your balance is fine. °· Move your legs often if you need to stand in one place for a long time. Tighten and relax your muscles in your legs while you are standing. °· Do not drive or operate heavy machinery if you feel dizzy. °· Avoid bending down if you feel dizzy. Place items in your home so that they are easy for you to reach without leaning over. °Lifestyle °· Do not use any tobacco products, including cigarettes, chewing tobacco, or electronic cigarettes. If you need help quitting, ask your health care provider. °· Try to reduce your stress level, such as with yoga or meditation. Talk with your health care provider if you need help. °General Instructions °· Watch your dizziness for any changes. °· Take medicines only as directed by your health care provider. Talk with your health care provider if you think that your dizziness is caused by a medicine that you are taking. °· Tell a friend or a family  member that you are feeling dizzy. If he or she notices any changes in your behavior, have this person call your health care provider. °· Keep all follow-up visits as directed by your health care provider. This is important. °SEEK MEDICAL CARE IF: °· Your dizziness does not go away. °· Your dizziness or light-headedness gets worse. °· You feel nauseous. °· You have reduced hearing. °· You have new symptoms. °· You are unsteady on your feet or you feel like the room is spinning. °SEEK IMMEDIATE MEDICAL CARE IF: °· You vomit or have diarrhea and are unable to eat or drink anything. °· You have problems talking, walking, swallowing, or using your arms, hands, or legs. °· You feel generally weak. °· You are not thinking clearly or you have trouble forming sentences. It may take a friend or family member to notice this. °· You have chest pain, abdominal pain, shortness of breath, or sweating. °· Your vision changes. °· You notice any bleeding. °· You have a headache. °· You have neck pain or a stiff neck. °· You have a fever. °  °This information is not intended to replace advice given to you by your health care provider. Make sure you discuss any questions you have with your health care provider. °  °Document Released: 04/08/2001 Document Revised: 02/27/2015 Document Reviewed: 10/09/2014 °Elsevier Interactive Patient Education ©2016 [employer] °General Headache Without Cause °A headache is pain or discomfort felt around the head or neck area. The specific   cause of a headache may not be found. There are many causes and types of headaches. A few common ones are: °· Tension headaches. °· Migraine headaches. °· Cluster headaches. °· Chronic daily headaches. °HOME CARE INSTRUCTIONS  °Watch your condition for any changes. Take these steps to help with your condition: °Managing Pain °· Take over-the-counter and prescription medicines only as told by your health care provider. °· Lie down in a dark, quiet room when you have  a headache. °· If directed, apply ice to the head and neck area: °¨ Put ice in a plastic bag. °¨ Place a towel between your skin and the bag. °¨ Leave the ice on for 20 minutes, 2-3 times per day. °· Use a heating pad or hot shower to apply heat to the head and neck area as told by your health care provider. °· Keep lights dim if bright lights bother you or make your headaches worse. °Eating and Drinking °· Eat meals on a regular schedule. °· Limit alcohol use. °· Decrease the amount of caffeine you drink, or stop drinking caffeine. °General Instructions °· Keep all follow-up visits as told by your health care provider. This is important. °· Keep a headache journal to help find out what may trigger your headaches. For example, write down: °¨ What you eat and drink. °¨ How much sleep you get. °¨ Any change to your diet or medicines. °· Try massage or other relaxation techniques. °· Limit stress. °· Sit up straight, and do not tense your muscles. °· Do not use tobacco products, including cigarettes, chewing tobacco, or e-cigarettes. If you need help quitting, ask your health care provider. °· Exercise regularly as told by your health care provider. °· Sleep on a regular schedule. Get 7-9 hours of sleep, or the amount recommended by your health care provider. °SEEK MEDICAL CARE IF:  °· Your symptoms are not helped by medicine. °· You have a headache that is different from the usual headache. °· You have nausea or you vomit. °· You have a fever. °SEEK IMMEDIATE MEDICAL CARE IF:  °· Your headache becomes severe. °· You have repeated vomiting. °· You have a stiff neck. °· You have a loss of vision. °· You have problems with speech. °· You have pain in the eye or ear. °· You have muscular weakness or loss of muscle control. °· You lose your balance or have trouble walking. °· You feel faint or pass out. °· You have confusion. °  °This information is not intended to replace advice given to you by your health care provider.  Make sure you discuss any questions you have with your health care provider. °  °Document Released: 10/13/2005 Document Revised: 07/04/2015 Document Reviewed: 02/05/2015 °Elsevier Interactive Patient Education ©2016 [employer] Inc. ° °

## 2016-05-15 NOTE — ED Notes (Addendum)
Pt able to eat graham crackers and keep fluids down.

## 2016-05-15 NOTE — ED Notes (Signed)
MD at bedside. 

## 2016-05-15 NOTE — Progress Notes (Signed)
   Subjective:    Patient ID: Lori Castillo, female    DOB: 07/05/1991, 25 y.o.   MRN: [id number]  HPI Patient report acute onset of vertigo and frontal headache 2 hours after going to work. [facility] due to unsteady gait. Patient also complain of nausea without vomiting. States LMP start j-13-17. No history of migraine headache.   Review of Systems Negative except for compliant.    Objective:   Physical Exam Obvious vertigo. Negative orthostatic reading. HEENT grossly unremarkable. Neck supple without adenopathy. Lungs CTA and Heart RRR. Dip UA unremarkable. Nef Urine  pregnancy test.     Assessment & Plan:Acute headache with vertigo.  To ED for definitive evaluation/treatment.

## 2016-05-15 NOTE — ED Provider Notes (Addendum)
[facility] Emergency Department Provider Note        Time seen: ----------------------------------------- 2:39 PM on 05/15/2016 -----------------------------------------    I have reviewed the triage vital signs and the nursing notes.   HISTORY  Chief Complaint Dizziness; Nausea; and Headache    HPI Lori Castillo is a 25 y.o. female who presents to the ER for headache that began while at work today. Patient reports she was walking around became dizzy and nauseous. Patient states the had pain worsened when she moved her head and when she tries to walk. Patient states it decreases when she lies still. She describes persistent balance disturbance with falling. Pain is in the frontal scalp, described as throbbing. Nothing makes it better. She denies fevers or other complaints.   Past Medical History  Diagnosis Date  . Amenorrhea     There are no active problems to display for this patient.   Past Surgical History  Procedure Laterality Date  . Nasal sinus surgery  2006    Allergies Bactrim  Social History Social History  Substance Use Topics  . Smoking status: Never Smoker   . Smokeless tobacco: Never Used  . Alcohol Use: 0.6 oz/week    1 Standard drinks or equivalent per week    Review of Systems Constitutional: Negative for fever. Eyes: Negative for visual changes. ENT: Negative for sore throat. Cardiovascular: Negative for chest pain. Respiratory: Negative for shortness of breath. Gastrointestinal: Negative for abdominal pain, vomiting and diarrhea. Genitourinary: Negative for dysuria. Musculoskeletal: Negative for back pain. Skin: Negative for rash. Neurological: Positive for headache, balance disturbance  10-point ROS otherwise negative.  ____________________________________________   PHYSICAL EXAM:  VITAL SIGNS: ED Triage Vitals  Enc Vitals Group     BP 05/15/16 1132 132/81 mmHg     Pulse Rate 05/15/16 1132 68     Resp  05/15/16 1132 18     Temp 05/15/16 1132 97.6 F (36.4 C)     Temp Source 05/15/16 1132 Oral     SpO2 05/15/16 1132 100 %     Weight 05/15/16 1132 150 lb (68.04 kg)     Height 05/15/16 1132 5\' 5"  (1.651 m)     Head Cir --      Peak Flow --      Pain Score 05/15/16 1133 8     Pain Loc --      Pain Edu? --      Excl. in GC? --     Constitutional: Alert and oriented. Well appearing and in no distress. Eyes: Conjunctivae are normal. PERRL. Normal extraocular movements. ENT   Head: Normocephalic and atraumatic.   Nose: No congestion/rhinnorhea.   Mouth/Throat: Mucous membranes are moist.   Neck: No stridor. Cardiovascular: Normal rate, regular rhythm. No murmurs, rubs, or gallops. Respiratory: Normal respiratory effort without tachypnea nor retractions. Breath sounds are clear and equal bilaterally. No wheezes/rales/rhonchi. Gastrointestinal: Soft and nontender. Normal bowel sounds Musculoskeletal: Nontender with normal range of motion in all extremities. No lower extremity tenderness nor edema. Neurologic:  Normal speech and language. Strength, sensation, cranial nerves appear intact. Positive Romberg with consistently falling forward with her eyes closed. Skin:  Skin is warm, dry and intact. No rash noted. Psychiatric: Mood and affect are normal. Speech and behavior are normal.  ____________________________________________  ED COURSE:  Pertinent labs & imaging results that were available during my care of the patient were reviewed by me and considered in my medical decision making (see chart for details). Patient is in  no acute distress but does have a concerning neurologic symptoms. We will give IV headache cocktail, obtain CT imaging. I will discuss with neurology.  EKG: Interpreted by me, normal sinus rhythm with a rate of 76/m, normal axis, normal intervals, normal EKG. ____________________________________________    LABS (pertinent positives/negatives)  Labs  Reviewed  CBC - Abnormal; Notable for the following:    RDW 17.6 (*)    All other components within normal limits  GLUCOSE, CAPILLARY - Abnormal; Notable for the following:    Glucose-Capillary 64 (*)    All other components within normal limits  BASIC METABOLIC PANEL  URINALYSIS COMPLETEWITH MICROSCOPIC ([facility] ONLY)  CBG MONITORING, ED  POC URINE PREG, ED    RADIOLOGY  CT head-Was declined by the patient  ____________________________________________  FINAL ASSESSMENT AND PLAN  Headache, Dizziness  Plan: Patient with labs and imaging as dictated above. Initially patient presented with headache and balance disturbance. This was of uncertain etiology. After Reglan Toradol her symptoms seemed to be 100% better. I still recommended CT imaging but she declined stating she was feeling better. I have advised her to return immediately should the symptoms recur. She appears stable for outpatient follow-up.   Emily Filbert, MD   Note: This dictation was prepared with Dragon dictation. Any transcriptional errors that result from this process are unintentional   Emily Filbert, MD 05/15/16 [id number]  Emily Filbert, MD 05/15/16 [id number]  Emily Filbert, MD 05/15/16 [phone]

## 2016-05-16 MED FILL — BUTALB-ACETAMIN-CAFF 50-325: 50-325-40 | 4 days supply | Qty: 20 | Fill #0

## 2016-05-18 ENCOUNTER — Encounter (HOSPITAL_COMMUNITY): Payer: Self-pay | Admitting: Emergency Medicine

## 2016-05-18 ENCOUNTER — Ambulatory Visit (HOSPITAL_COMMUNITY)
Admission: EM | Admit: 2016-05-18 | Discharge: 2016-05-18 | Disposition: A | Discharge status: Home / Self Care | Payer: 59 | Attending: Physician Assistant | Admitting: Physician Assistant

## 2016-05-18 DIAGNOSIS — G43109 Migraine with aura, not intractable, without status migrainosus: Secondary | ICD-10-CM

## 2016-05-18 DIAGNOSIS — G43909 Migraine, unspecified, not intractable, without status migrainosus: Secondary | ICD-10-CM

## 2016-05-18 LAB — POCT PREGNANCY, URINE: PREG TEST UR: NEGATIVE

## 2016-05-18 NOTE — ED Triage Notes (Signed)
Pt. Is questioning being pregnant. LMP 07/17-2017

## 2016-05-18 NOTE — ED Triage Notes (Signed)
Pt. Stated, I've had a headache a since last Thursday and went to ER at [facility] and given toradol and fluids.  My ears ar ringing and I feel like Im off balance.

## 2016-05-18 NOTE — ED Provider Notes (Signed)
CSN: [id number]     Arrival date & time 05/18/16  1238 History   None    Chief Complaint  Patient presents with  . Headache  . Tinnitus   (Consider location/radiation/quality/duration/timing/severity/associated sxs/prior Treatment)  Headache  PT Is a 25 year old female who was seen in the emergency department a couple of days ago for headache gait disturbance. She had a normal neurologic exam at that time. Emergency department physician had requested that she have his CT she declined twice stating that she was feeling better and would like to go home. She is now here stating that her symptoms are getting worse and she would like answers as to why. He continues to have nausea and ringing in her years.       Past Medical History:  Diagnosis Date  . Amenorrhea    Past Surgical History:  Procedure Laterality Date  . NASAL SINUS SURGERY  2006   Family History  Problem Relation Age of Onset  . Hypertension Paternal Grandmother   . Diabetes Paternal Grandmother   . Hypertension Paternal Grandfather   . Diabetes Paternal Grandfather   . Diabetes Maternal Aunt   . Hypertension Paternal Aunt   . Diabetes Paternal Aunt   . Hypertension Paternal Uncle   . Diabetes Paternal Uncle    Social History  Substance Use Topics  . Smoking status: Never Smoker  . Smokeless tobacco: Never Used  . Alcohol use 0.6 oz/week    1 Standard drinks or equivalent per week   OB History    Gravida Para Term Preterm AB Living   0 0 0 0 0 0   SAB TAB Ectopic Multiple Live Births   0 0 0 0       Review of Systems  Neurological: Positive for headaches.    Denies:  NAUSEA,  CHEST PAIN, CONGESTION, DYSURIA, SHORTNESS OF BREATH  Allergies  Bactrim [sulfamethoxazole-trimethoprim]  Home Medications   Prior to Admission medications   Medication Sig Start Date End Date Taking? Authorizing Provider  butalbital-acetaminophen-caffeine (FIORICET) 4107027718 MG tablet Take 1-2 tablets by mouth every 6  (six) hours as needed for headache. 05/15/16 05/15/17  Emily Filbert, MD  misoprostol (CYTOTEC) 200 MCG tablet Place 2 tablets intravaginally 6-12 hours prior to IUD insertion Patient not taking: Reported on 05/15/2016 05/12/16   Romualdo Bolk, MD   Meds Ordered and Administered this Visit  Medications - No data to display  BP 125/81 (BP Location: Left Arm)   Pulse 82   Temp 98.8 F (37.1 C) (Oral)   Resp 17   Ht 5\' 5"  (1.651 m)   Wt 150 lb (68 kg)   LMP 05/12/2016   SpO2 99%   BMI 24.96 kg/m  No data found.   Physical Exam NURSES NOTES AND VITAL SIGNS REVIEWED. CONSTITUTIONAL: Well developed, well nourished, no acute distress HEENT: normocephalic, atraumatic EYES: Conjunctiva normal NECK:normal ROM, supple, no adenopathy PULMONARY:No respiratory distress, normal effort ABDOMINAL: Soft, ND, NT BS+, No CVAT MUSCULOSKELETAL: Normal ROM of all extremities,  SKIN: warm and dry without rash PSYCHIATRIC: Mood and affect, behavior are normal  Urgent Care Course   Clinical Course  Value Comment By Time  Pulse Rate: 82 (Reviewed) Tharon Aquas, PA 07/23 1317  Preg Test, Ur: NEGATIVE (Reviewed) Tharon Aquas, PA 07/23 1343    Procedures (including critical care time)  Labs Review Labs Reviewed - No data to display  Imaging Review No results found.   Visual Acuity Review  Right Eye Distance:  Left Eye Distance:   Bilateral Distance:    Right Eye Near:   Left Eye Near:    Bilateral Near:         MDM   1. Migraine syndrome    PT IS ADVISED AT THIS TIME THAT TINGLING IN HER ARM, CONTINUED HEADACHE AND EAR PAIN SHE MAY HAVE MIGRAINE COMPLEX BUT MAY ALSO NEED A CT HEAD.     THIS NOTE WAS GENERATED USING A VOICE RECOGNITION SOFTWARE PROGRAM. ALL REASONABLE EFFORTS  WERE MADE TO PROOFREAD THIS DOCUMENT FOR ACCURACY.  I have verbally reviewed the discharge instructions with the patient. A printed AVS was given to the patient.  All questions were  answered prior to discharge.      Tharon Aquas, PA 05/18/16 1404

## 2016-05-18 NOTE — Discharge Instructions (Signed)
YOU ARE ADVISED TO GO TO ER FOR FURTHER EVALUATION OF YOUR NEUROLOGIC SYMPTOMS.

## 2016-05-19 ENCOUNTER — Telehealth: Payer: Self-pay | Admitting: Obstetrics and Gynecology

## 2016-05-19 DIAGNOSIS — R519 Headache, unspecified: Secondary | ICD-10-CM

## 2016-05-19 DIAGNOSIS — R51 Headache: Principal | ICD-10-CM

## 2016-05-19 NOTE — Telephone Encounter (Signed)
Call to patient and she is given message from Dr. Oscar La.  Ordered Non-contrast Head CT to [facility]. Patient is [employer].  She is given central scheduling phone number to schedule appointment.  Recent urine pregnancy testing in urgent care negative.  Referral to Dr. Lucia Gaskins at [facility] placed and patient agreeable to plan.   She is aware to seek care at closest emergency department with any concerns or worsening symptoms.  Routing to provider for final review. Patient agreeable to disposition. Will close encounter.

## 2016-05-19 NOTE — Telephone Encounter (Signed)
Patient would like to speak with nurse about Dr Oscar La ordering a head ct scan for her. She was seen over at [facility] and was told she needed one.

## 2016-05-19 NOTE — Telephone Encounter (Signed)
Notes from the ER and urgent care reviewed. Given the recommendation by the ER MD, I think it is reasonable to order the head CT for her (without contrast) since she doesn't have a primary. She will need to see neurology for f/u. If her symptoms worsen she should return to the ER

## 2016-05-19 NOTE — Telephone Encounter (Signed)
Spoke with patient. She was seen in ED on 7/20 and Urgent Care yesterday with complaints of headache, loss of balance and dizziness with changing of positions. She feels it could be chronic sinusitis but reports they did not agree with her at Urgent Care yesterday. She declined CT scan in Emergency department. She would like to have a CT of her head now on an outpatient basis since she is not having emergency symptoms. I advised that likely as her gynecologist, Dr. Oscar La would not order a CT scan for her.  Patient does not have a PCP.  Advised may be able to obtain referral to [facility]. Patient states "I don't have a PCP and I have had other specialty providers order for me before, I am getting an IUD on Thursday and I'd like to have this wrapped up before then."  Advised will send message to Dr. Oscar La and return her call.

## 2016-05-20 ENCOUNTER — Other Ambulatory Visit (INDEPENDENT_AMBULATORY_CARE_PROVIDER_SITE_OTHER): Payer: 59

## 2016-05-20 ENCOUNTER — Ambulatory Visit (HOSPITAL_COMMUNITY)
Admission: RE | Admit: 2016-05-20 | Discharge: 2016-05-20 | Disposition: A | Discharge status: Home / Self Care | Payer: 59 | Source: Ambulatory Visit | Attending: Obstetrics and Gynecology | Admitting: Obstetrics and Gynecology

## 2016-05-20 DIAGNOSIS — R42 Dizziness and giddiness: Secondary | ICD-10-CM | POA: Insufficient documentation

## 2016-05-20 DIAGNOSIS — N915 Oligomenorrhea, unspecified: Secondary | ICD-10-CM | POA: Diagnosis not present

## 2016-05-20 DIAGNOSIS — R269 Unspecified abnormalities of gait and mobility: Secondary | ICD-10-CM | POA: Diagnosis not present

## 2016-05-20 DIAGNOSIS — R51 Headache: Secondary | ICD-10-CM | POA: Insufficient documentation

## 2016-05-20 DIAGNOSIS — R519 Headache, unspecified: Secondary | ICD-10-CM

## 2016-05-20 LAB — HCG, SERUM, QUALITATIVE: PREG SERUM: NEGATIVE

## 2016-05-20 NOTE — Telephone Encounter (Signed)
-----   Message from Romualdo Bolk, MD sent at 05/20/2016 12:27 PM EDT ----- Please inform negative pregnancy test, we are a go for the IUD tomorrow. She can take the cytotec.

## 2016-05-20 NOTE — Telephone Encounter (Signed)
Call to patient and she is advised of negative pregnancy test, she has IUD insertion plannned for Thursday, date confirmed. With patient.   Advised of preliminary results of Head CT, negative. Advised would call her back if Dr. Oscar La had any further orders. Patient unsure if she wants to proceed with neurology referral but advised patient it may be good to establish care since she had episodes of headache/dizziness/gait disturbance and ED visits.   Routing to provider for final review. Patient agreeable to disposition. Will close encounter.

## 2016-05-22 ENCOUNTER — Encounter: Payer: Self-pay | Admitting: Obstetrics and Gynecology

## 2016-05-22 ENCOUNTER — Ambulatory Visit (INDEPENDENT_AMBULATORY_CARE_PROVIDER_SITE_OTHER): Payer: 59 | Admitting: Obstetrics and Gynecology

## 2016-05-22 ENCOUNTER — Telehealth: Payer: Self-pay | Admitting: Obstetrics and Gynecology

## 2016-05-22 VITALS — BP 110/60 | HR 80 | Resp 14 | Wt 155.0 lb

## 2016-05-22 DIAGNOSIS — Z3009 Encounter for other general counseling and advice on contraception: Secondary | ICD-10-CM

## 2016-05-22 DIAGNOSIS — Z113 Encounter for screening for infections with a predominantly sexual mode of transmission: Secondary | ICD-10-CM

## 2016-05-22 DIAGNOSIS — Z30430 Encounter for insertion of intrauterine contraceptive device: Secondary | ICD-10-CM

## 2016-05-22 NOTE — Patient Instructions (Signed)

## 2016-05-22 NOTE — Telephone Encounter (Signed)
Patient declined to schedule at check out for: Return in about 4 weeks (around 06/19/2016) for IUD check. She said she will call back to schedule at a later time.

## 2016-05-22 NOTE — Progress Notes (Signed)
GYNECOLOGY  VISIT   HPI: 25 y.o.   Married  [place]  female   G0P0000 with Patient's last menstrual period was 05/12/2016.   here for  [facility] IUD insertion   GYNECOLOGIC HISTORY: Patient's last menstrual period was 05/12/2016. Contraception:none Menopausal hormone therapy: None         OB History    Gravida Para Term Preterm AB Living   0 0 0 0 0 0   SAB TAB Ectopic Multiple Live Births   0 0 0 0           There are no active problems to display for this patient.   Past Medical History:  Diagnosis Date  . Amenorrhea     Past Surgical History:  Procedure Laterality Date  . NASAL SINUS SURGERY  2006    Current Outpatient Prescriptions  Medication Sig Dispense Refill  . butalbital-acetaminophen-caffeine (FIORICET) 50-325-40 MG tablet Take 1-2 tablets by mouth every 6 (six) hours as needed for headache. 20 tablet 0  . misoprostol (CYTOTEC) 200 MCG tablet Place 2 tablets intravaginally 6-12 hours prior to IUD insertion 2 tablet 0   No current facility-administered medications for this visit.      ALLERGIES: Bactrim [sulfamethoxazole-trimethoprim]  Family History  Problem Relation Age of Onset  . Hypertension Paternal Grandmother   . Diabetes Paternal Grandmother   . Hypertension Paternal Grandfather   . Diabetes Paternal Grandfather   . Diabetes Maternal Aunt   . Hypertension Paternal Aunt   . Diabetes Paternal Aunt   . Hypertension Paternal Uncle   . Diabetes Paternal Uncle     Social History   Social History  . Marital status: Married    Spouse name: N/A  . Number of children: N/A  . Years of education: N/A   Occupational History  . Not on file.   Social History Main Topics  . Smoking status: Never Smoker  . Smokeless tobacco: Never Used  . Alcohol use 0.6 oz/week    1 Standard drinks or equivalent per week  . Drug use: No  . Sexual activity: Yes    Partners: Male   Other Topics Concern  . Not on file   Social History Narrative  .  No narrative on file    Review of Systems  Constitutional: Negative.   HENT: Negative.   Eyes: Negative.   Respiratory: Negative.   Cardiovascular: Negative.   Gastrointestinal: Negative.   Genitourinary: Negative.   Musculoskeletal: Negative.   Skin: Negative.   Neurological: Negative.   Endo/Heme/Allergies: Negative.   Psychiatric/Behavioral: Negative.     PHYSICAL EXAMINATION:    BP 110/60 (BP Location: Right Arm, Patient Position: Sitting, Cuff Size: Normal)   Pulse 80   Resp 14   Wt 155 lb (70.3 kg)   LMP 05/12/2016   BMI 25.79 kg/m     General appearance: alert, cooperative and appears stated age  Pelvic: External genitalia:  no lesions              Urethra:  normal appearing urethra with no masses, tenderness or lesions              Bartholins and Skenes: normal                 Vagina: normal appearing vagina with normal color and discharge, no lesions              Cervix: no lesions  The risks of the mirena IUD were reviewed with the patient, including infection, abnormal bleeding and uterine perfortion. Consent was signed.  A speculum was placed in the vagina, the cervix was cleansed with betadine. A tenaculum was placed on the cervix, the uterus sounded to 7 cm.  The kyleena IUD was inserted without difficulty. The string were cut to 3-4 cm. The tenaculum was removed. Slight oozing from the tenaculum site was stopped with pressure.   The patient tolerated the procedure well.    Chaperone was present for exam.  ASSESSMENT Kyleena IUD insertion    PLAN Genprobe done Use back up for 1 week Call with any concerns F/U in 1 month   An After Visit Summary was printed and given to the patient.

## 2016-05-23 LAB — GC/CHLAMYDIA PROBE AMP
CT Probe RNA: NOT DETECTED
GC Probe RNA: NOT DETECTED

## 2016-05-29 NOTE — Telephone Encounter (Signed)
Attempted to reach patient. Mailbox is full, unable to leave message to return call.

## 2016-05-29 NOTE — Telephone Encounter (Signed)
Call to patient.  Mailbox full.

## 2016-05-29 NOTE — Telephone Encounter (Signed)
Patient wants to speak with the nurse. No information given. °

## 2016-05-29 NOTE — Telephone Encounter (Signed)
Taking the plan B was appropriate. I would recommend she check a UPT in 2 weeks.

## 2016-05-29 NOTE — Telephone Encounter (Signed)
Patient returned call.  LMP 05/12/16, Kyleena IUD placed 05/22/16 after negative pregnancy test on 05/20/16.   Patient reports that she was sexually active without protection on 05/27/16 and took plan B on 05/29/16.    Patient wants to know what she should expect or do at this point? What is her risk for pregnancy with IUD in place and unprotected intercourse after 6 days.   Advised will send message to Dr. Oscar La and return call with advice. Patient agreeable.

## 2016-05-29 NOTE — Telephone Encounter (Signed)
Spoke with patient. Advised of message as seen below from Dr.Jertson. Patient is agreeable and verbalizes understanding.  Routing to provider for final review. Patient agreeable to disposition. Will close encounter.  

## 2016-06-23 ENCOUNTER — Telehealth: Payer: Self-pay | Admitting: Obstetrics and Gynecology

## 2016-06-23 NOTE — Telephone Encounter (Signed)
Patient called and cancelled follow up appointment on 06/24/16.  Patient requested to reschedule appointment on 07/09/16.  Patient has been rescheduled for 07/09/16 with Dr. Oscar LaJertson.  Routing to Dr Oscar LaJertson for final review

## 2016-06-24 ENCOUNTER — Ambulatory Visit: Payer: 59 | Admitting: Obstetrics and Gynecology

## 2016-07-09 ENCOUNTER — Ambulatory Visit (INDEPENDENT_AMBULATORY_CARE_PROVIDER_SITE_OTHER): Payer: 59 | Admitting: Obstetrics and Gynecology

## 2016-07-09 ENCOUNTER — Encounter: Payer: Self-pay | Admitting: Obstetrics and Gynecology

## 2016-07-09 VITALS — BP 110/68 | HR 76 | Resp 16 | Ht 65.0 in | Wt 155.2 lb

## 2016-07-09 DIAGNOSIS — Z30431 Encounter for routine checking of intrauterine contraceptive device: Secondary | ICD-10-CM | POA: Diagnosis not present

## 2016-07-09 NOTE — Progress Notes (Signed)
GYNECOLOGY  VISIT   HPI: 25 y.o.   Married  [place]  female   G0P0000 with Patient's last menstrual period was 07/07/2016.   [facility]. She had light bleeding off and on for about 3 weeks. She is on her cycle, bleeding starts and stops. She had cramps the first month, not any more. No dyspareunia.  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   GYNECOLOGIC HISTORY: Patient's last menstrual period was 07/07/2016. Contraception:Kyleena IUD Menopausal hormone therapy: none        OB History    Gravida Para Term Preterm AB Living   0 0 0 0 0 0   SAB TAB Ectopic Multiple Live Births   0 0 0 0           There are no active problems to display for this patient.   Past Medical History:  Diagnosis Date  . Amenorrhea     Past Surgical History:  Procedure Laterality Date  . NASAL SINUS SURGERY  2006    Current Outpatient Prescriptions  Medication Sig Dispense Refill  . Levonorgestrel (KYLEENA) 19.5 MG IUD by Intrauterine route once.     No current facility-administered medications for this visit.      ALLERGIES: Bactrim [sulfamethoxazole-trimethoprim]  Family History  Problem Relation Age of Onset  . Hypertension Paternal Grandmother   . Diabetes Paternal Grandmother   . Hypertension Paternal Grandfather   . Diabetes Paternal Grandfather   . Diabetes Maternal Aunt   . Hypertension Paternal Aunt   . Diabetes Paternal Aunt   . Hypertension Paternal Uncle   . Diabetes Paternal Uncle     Social History   Social History  . Marital status: Married    Spouse name: N/A  . Number of children: N/A  . Years of education: N/A   Occupational History  . Not on file.   Social History Main Topics  . Smoking status: Never Smoker  . Smokeless tobacco: Never Used  . Alcohol use 0.6 oz/week    1 Standard drinks or equivalent per week  . Drug use: No  . Sexual activity: Yes    Partners: Male   Other Topics Concern  .  Not on file   Social History Narrative  . No narrative on file    Review of Systems  Constitutional: Negative.   HENT: Negative.   Eyes: Negative.   Respiratory: Negative.   Cardiovascular: Negative.   Gastrointestinal: Negative.   Genitourinary: Negative.   Musculoskeletal: Negative.   Skin: Negative.   Neurological: Negative.   Endo/Heme/Allergies: Negative.   Psychiatric/Behavioral: Negative.     PHYSICAL EXAMINATION:    BP 110/68 (BP Location: Right Arm, Patient Position: Sitting, Cuff Size: Normal)   Pulse 76   Resp 16   Ht 5\' 5"  (1.651 m)   Wt 155 lb 3.2 oz (70.4 kg)   LMP 07/07/2016   BMI 25.83 kg/m     General appearance: alert, cooperative and appears stated age  Pelvic: External genitalia:  no lesions              Urethra:  normal appearing urethra with no masses, tenderness or lesions              Bartholins and Skenes: normal                 Vagina: normal appearing vagina with normal color and discharge, no lesions  Cervix: no lesions and IUD string 3-4 cm              Bimanual Exam:  Uterus:  normal size, contour, position, consistency, mobility, non-tender              Adnexa: no mass, fullness, tenderness              Chaperone was present for exam.  ASSESSMENT IUD check, doing well H/O oligomenorrhea    PLAN She is moving to [facility], will transfer care Due for an annual exam next month   An After Visit Summary was printed and given to the patient.

## 2017-07-03 IMAGING — CT CT HEAD W/O CM
3 of 4 series · 17 of 47 positions shown, 20 images · non-contrast
Comparison: None.

CLINICAL DATA: Headaches and dizziness for 2 weeks.

EXAM:
CT HEAD WITHOUT CONTRAST
TECHNIQUE: Contiguous axial images were obtained from the base of the skull
through the vertex without intravenous contrast.

[Series 2: head w/o · axial · non-contrast · 0.47mm/px · z∈[+1084,+1204]mm · 11 of 30 slices shown, 14 images]
[im 3/30  brain]
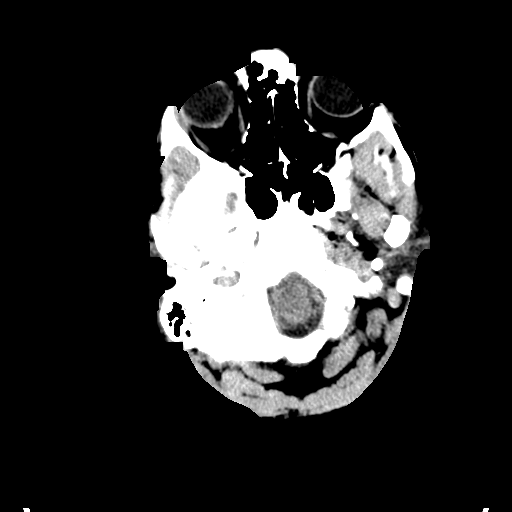
[im 3/30  bone]
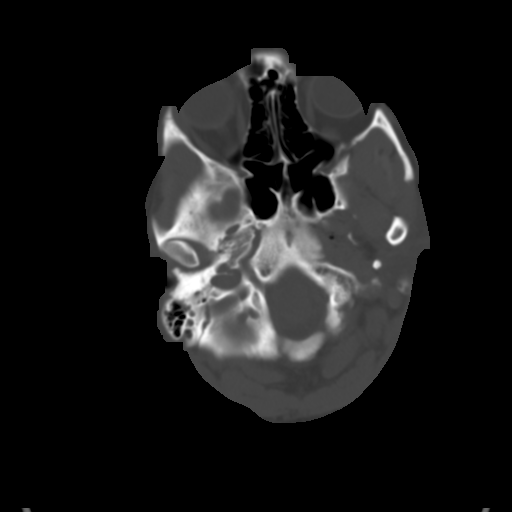
[im 5/30  brain]
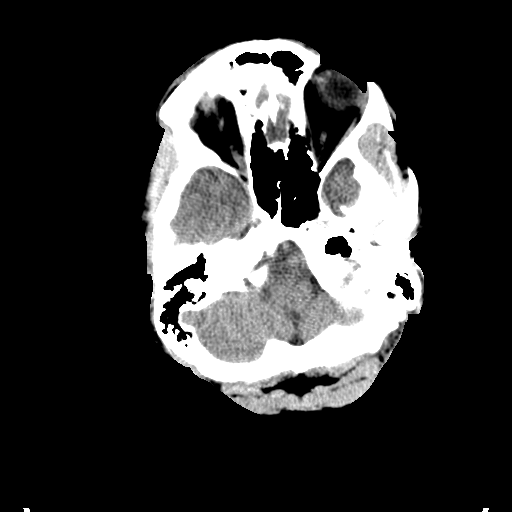
[im 7/30  brain]
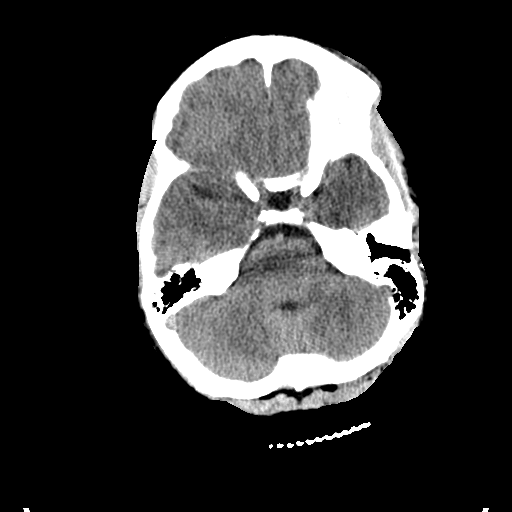
[im 11/30  brain]
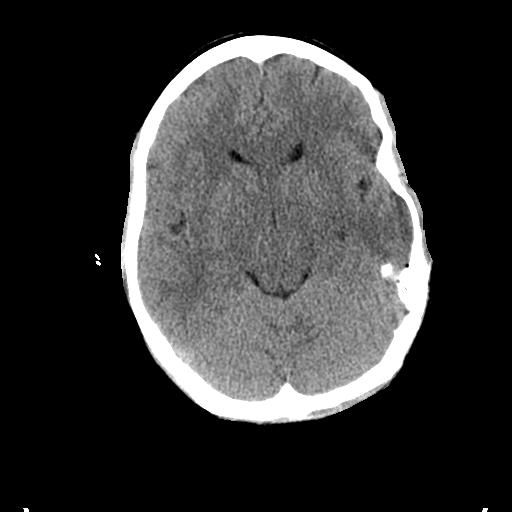
[im 13/30  brain]
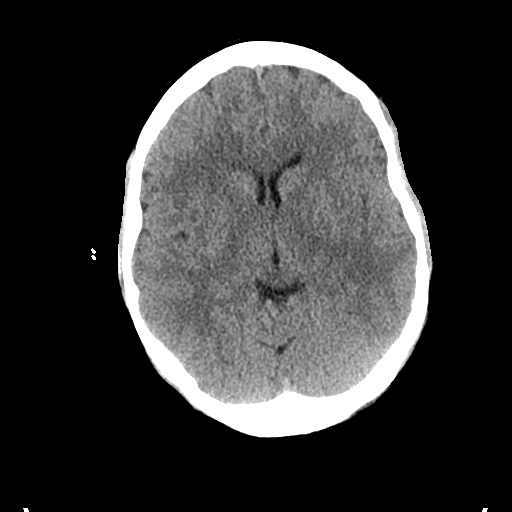
[im 13/30  bone]
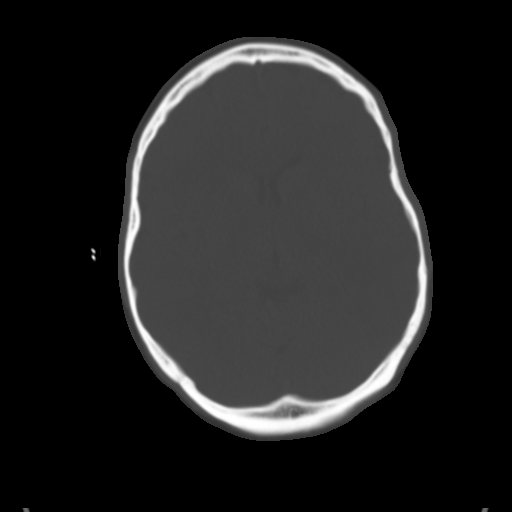
[im 15/30  brain]
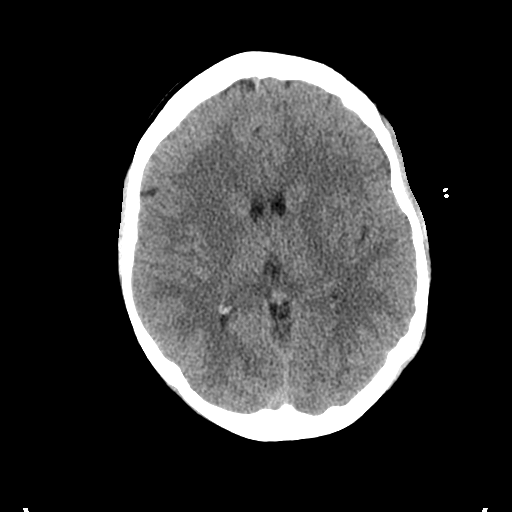
[im 17/30  brain]
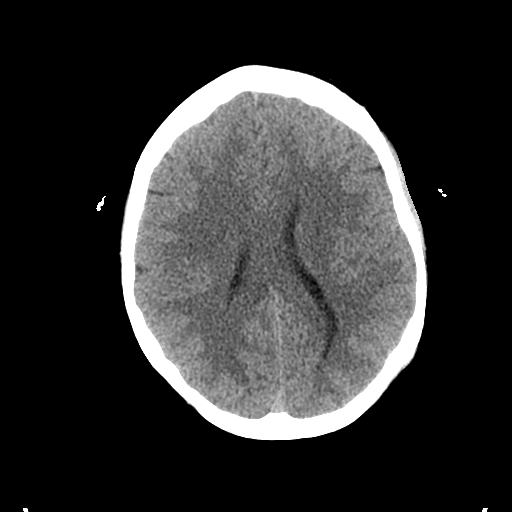
[im 19/30  brain]
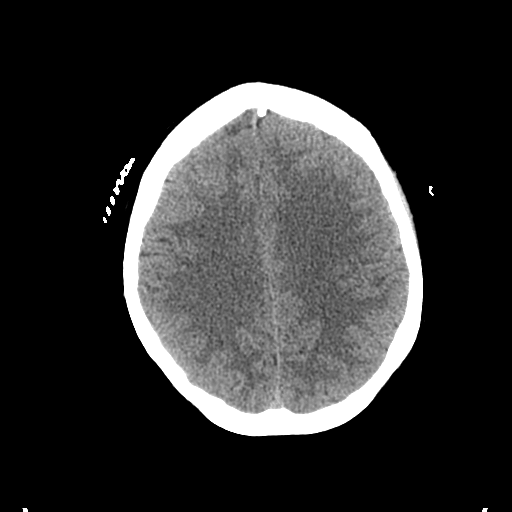
[im 23/30  brain]
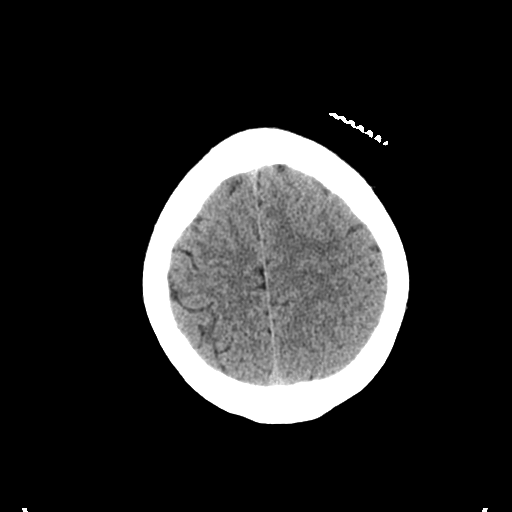
[im 23/30  bone]
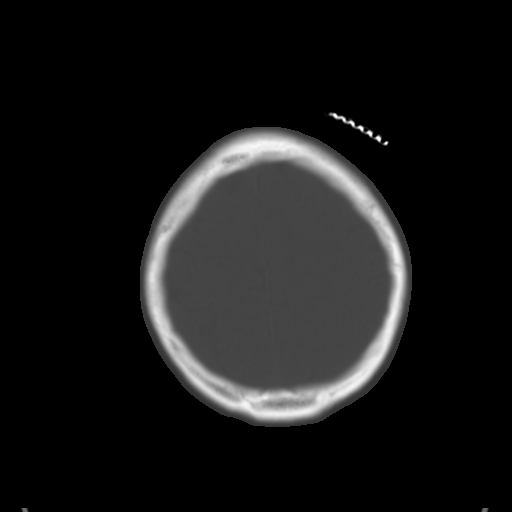
[im 25/30  brain]
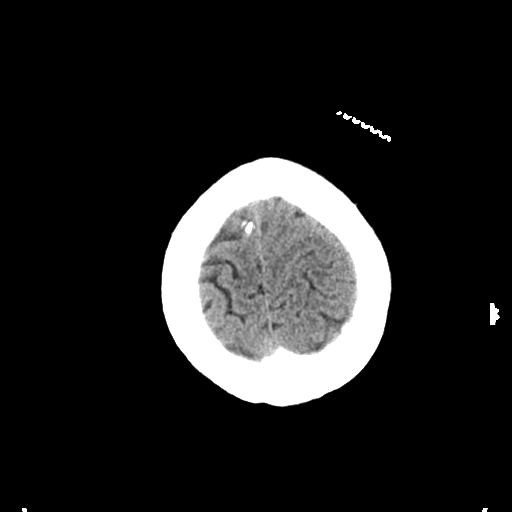
[im 27/30  brain]
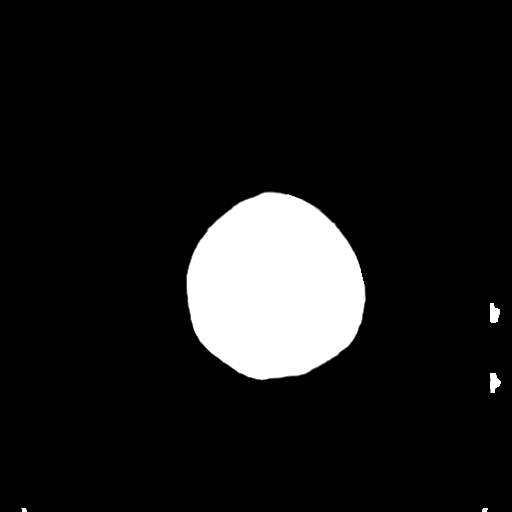

[Series 602: <mpr thick range> · coronal · 0.49mm/px · 3 of 106 slices shown]
[im 36/106  brain]
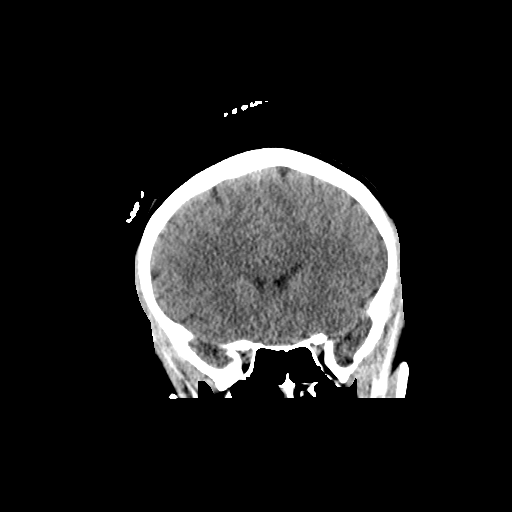
[im 47/106  brain]
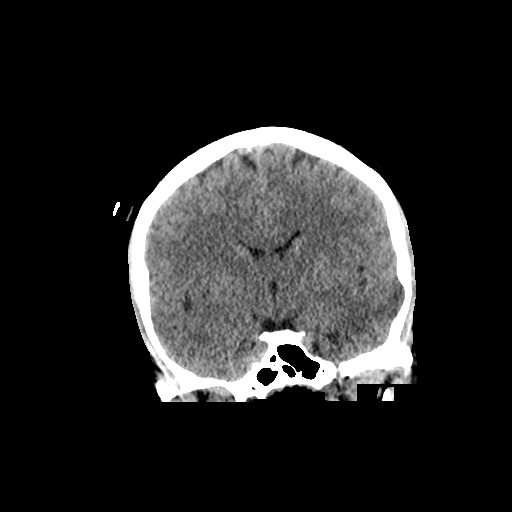
[im 59/106  brain]
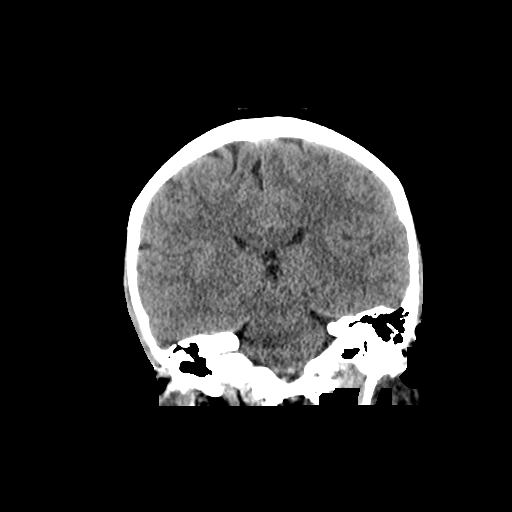

[Series 603: <mpr thick range(1)> · sagittal · 0.49mm/px · 3 of 81 slices shown]
[im 27/81  brain]
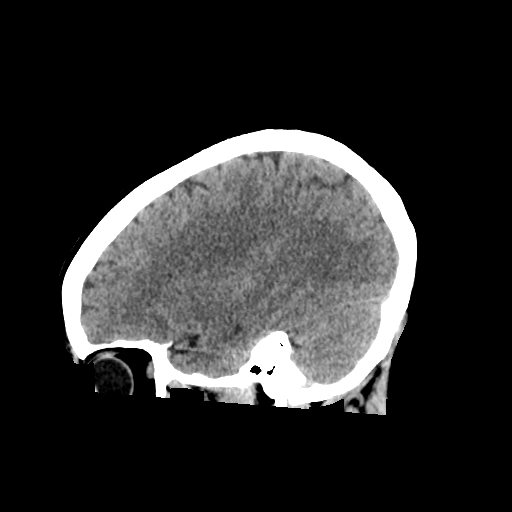
[im 41/81  brain]
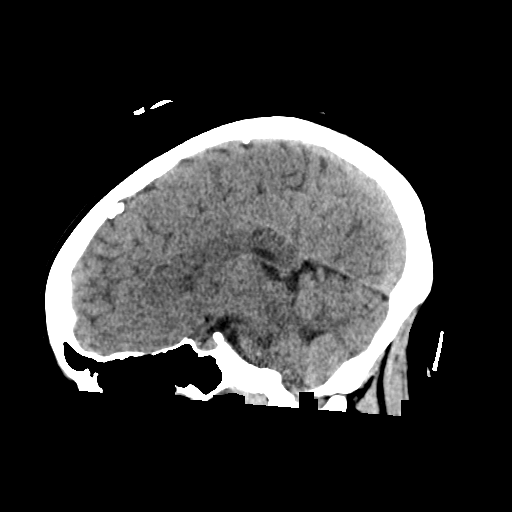
[im 54/81  brain]
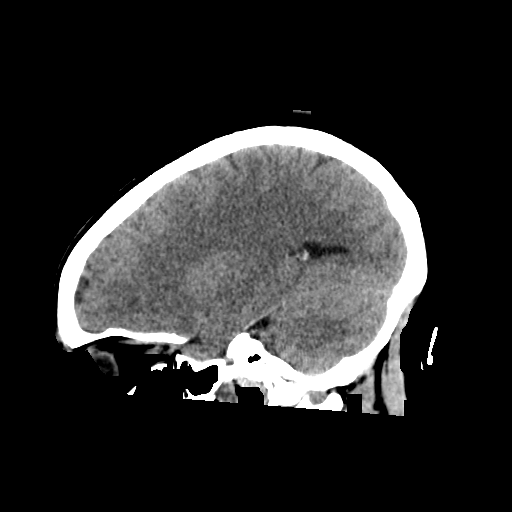

[17 of 47 positions shown; findings below may reference images not displayed]

FINDINGS: No acute cortical infarct, hemorrhage, or mass lesion is present.
The ventricles are of normal size. No significant extra-axial fluid
collection is evident. The paranasal sinuses and mastoid air cells
are clear. The calvarium is intact. No significant extracranial soft
tissue lesion is present. The globes and orbits are intact.
IMPRESSION: Negative CT of the head.
